# Patient Record
Sex: Female | Born: 1955
Health system: Southern US, Community
[De-identification: ages and names within clinical notes are randomized; demographics above are authoritative.]

## PROBLEM LIST (undated history)

## (undated) DIAGNOSIS — N6310 Unspecified lump in the right breast, unspecified quadrant: Secondary | ICD-10-CM

## (undated) DIAGNOSIS — E785 Hyperlipidemia, unspecified: Secondary | ICD-10-CM

## (undated) HISTORY — PX: BUNIONECTOMY: SHX129

## (undated) HISTORY — PX: BREAST BIOPSY: SHX20

## (undated) HISTORY — PX: BREAST EXCISIONAL BIOPSY: SUR124

---

## 1996-04-03 HISTORY — PX: BREAST EXCISIONAL BIOPSY: SUR124

## 1996-04-03 HISTORY — PX: BREAST SURGERY: SHX581

## 2014-09-15 ENCOUNTER — Other Ambulatory Visit (HOSPITAL_BASED_OUTPATIENT_CLINIC_OR_DEPARTMENT_OTHER): Payer: Self-pay | Admitting: Family Medicine

## 2014-09-15 DIAGNOSIS — Z1231 Encounter for screening mammogram for malignant neoplasm of breast: Secondary | ICD-10-CM

## 2014-09-22 ENCOUNTER — Ambulatory Visit (HOSPITAL_BASED_OUTPATIENT_CLINIC_OR_DEPARTMENT_OTHER)
Admission: RE | Admit: 2014-09-22 | Discharge: 2014-09-22 | Disposition: A | Discharge status: Home / Self Care | Payer: BLUE CROSS/BLUE SHIELD | Source: Ambulatory Visit | Attending: Family Medicine | Admitting: Family Medicine

## 2014-09-22 DIAGNOSIS — Z1231 Encounter for screening mammogram for malignant neoplasm of breast: Secondary | ICD-10-CM | POA: Insufficient documentation

## 2015-08-17 ENCOUNTER — Other Ambulatory Visit (HOSPITAL_BASED_OUTPATIENT_CLINIC_OR_DEPARTMENT_OTHER): Payer: Self-pay | Admitting: Family Medicine

## 2015-08-17 DIAGNOSIS — Z1231 Encounter for screening mammogram for malignant neoplasm of breast: Secondary | ICD-10-CM

## 2015-09-24 DIAGNOSIS — E785 Hyperlipidemia, unspecified: Secondary | ICD-10-CM | POA: Diagnosis not present

## 2015-09-24 DIAGNOSIS — E669 Obesity, unspecified: Secondary | ICD-10-CM | POA: Diagnosis not present

## 2015-09-24 DIAGNOSIS — Z0000 Encounter for general adult medical examination without abnormal findings: Secondary | ICD-10-CM | POA: Diagnosis not present

## 2015-09-24 DIAGNOSIS — Z124 Encounter for screening for malignant neoplasm of cervix: Secondary | ICD-10-CM | POA: Diagnosis not present

## 2015-09-27 ENCOUNTER — Ambulatory Visit (HOSPITAL_BASED_OUTPATIENT_CLINIC_OR_DEPARTMENT_OTHER)
Admission: RE | Admit: 2015-09-27 | Discharge: 2015-09-27 | Disposition: A | Discharge status: Home / Self Care | Payer: BLUE CROSS/BLUE SHIELD | Source: Ambulatory Visit | Attending: Family Medicine | Admitting: Family Medicine

## 2015-09-27 DIAGNOSIS — Z1231 Encounter for screening mammogram for malignant neoplasm of breast: Secondary | ICD-10-CM | POA: Diagnosis not present

## 2015-10-22 DIAGNOSIS — E669 Obesity, unspecified: Secondary | ICD-10-CM | POA: Diagnosis not present

## 2015-12-14 DIAGNOSIS — L820 Inflamed seborrheic keratosis: Secondary | ICD-10-CM | POA: Diagnosis not present

## 2015-12-14 DIAGNOSIS — L821 Other seborrheic keratosis: Secondary | ICD-10-CM | POA: Diagnosis not present

## 2015-12-14 DIAGNOSIS — Z85828 Personal history of other malignant neoplasm of skin: Secondary | ICD-10-CM | POA: Diagnosis not present

## 2015-12-14 DIAGNOSIS — Z08 Encounter for follow-up examination after completed treatment for malignant neoplasm: Secondary | ICD-10-CM | POA: Diagnosis not present

## 2015-12-14 DIAGNOSIS — L570 Actinic keratosis: Secondary | ICD-10-CM | POA: Diagnosis not present

## 2016-09-05 ENCOUNTER — Other Ambulatory Visit (HOSPITAL_BASED_OUTPATIENT_CLINIC_OR_DEPARTMENT_OTHER): Payer: Self-pay | Admitting: Family Medicine

## 2016-09-05 ENCOUNTER — Other Ambulatory Visit: Payer: Self-pay | Admitting: Medical

## 2016-09-05 DIAGNOSIS — Z1231 Encounter for screening mammogram for malignant neoplasm of breast: Secondary | ICD-10-CM

## 2016-09-29 ENCOUNTER — Ambulatory Visit (HOSPITAL_BASED_OUTPATIENT_CLINIC_OR_DEPARTMENT_OTHER)
Admission: RE | Admit: 2016-09-29 | Discharge: 2016-09-29 | Disposition: A | Discharge status: Home / Self Care | Payer: BLUE CROSS/BLUE SHIELD | Source: Ambulatory Visit | Attending: Family Medicine | Admitting: Family Medicine

## 2016-09-29 DIAGNOSIS — R928 Other abnormal and inconclusive findings on diagnostic imaging of breast: Secondary | ICD-10-CM | POA: Insufficient documentation

## 2016-09-29 DIAGNOSIS — Z1231 Encounter for screening mammogram for malignant neoplasm of breast: Secondary | ICD-10-CM | POA: Diagnosis not present

## 2016-10-03 ENCOUNTER — Other Ambulatory Visit: Payer: Self-pay | Admitting: Family Medicine

## 2016-10-03 DIAGNOSIS — R928 Other abnormal and inconclusive findings on diagnostic imaging of breast: Secondary | ICD-10-CM

## 2016-10-10 ENCOUNTER — Ambulatory Visit
Admission: RE | Admit: 2016-10-10 | Discharge: 2016-10-10 | Disposition: A | Discharge status: Home / Self Care | Payer: BLUE CROSS/BLUE SHIELD | Source: Ambulatory Visit | Attending: Family Medicine | Admitting: Family Medicine

## 2016-10-10 ENCOUNTER — Other Ambulatory Visit: Payer: Self-pay | Admitting: Family Medicine

## 2016-10-10 DIAGNOSIS — R928 Other abnormal and inconclusive findings on diagnostic imaging of breast: Secondary | ICD-10-CM

## 2016-10-10 DIAGNOSIS — N6489 Other specified disorders of breast: Secondary | ICD-10-CM

## 2016-10-10 DIAGNOSIS — R922 Inconclusive mammogram: Secondary | ICD-10-CM | POA: Diagnosis not present

## 2016-10-10 DIAGNOSIS — N6311 Unspecified lump in the right breast, upper outer quadrant: Secondary | ICD-10-CM | POA: Diagnosis not present

## 2016-10-11 ENCOUNTER — Ambulatory Visit
Admission: RE | Admit: 2016-10-11 | Discharge: 2016-10-11 | Disposition: A | Discharge status: Home / Self Care | Payer: BLUE CROSS/BLUE SHIELD | Source: Ambulatory Visit | Attending: Family Medicine | Admitting: Family Medicine

## 2016-10-11 DIAGNOSIS — N6489 Other specified disorders of breast: Secondary | ICD-10-CM

## 2016-10-11 DIAGNOSIS — N6011 Diffuse cystic mastopathy of right breast: Secondary | ICD-10-CM | POA: Diagnosis not present

## 2016-10-11 DIAGNOSIS — N6311 Unspecified lump in the right breast, upper outer quadrant: Secondary | ICD-10-CM | POA: Diagnosis not present

## 2016-11-09 ENCOUNTER — Other Ambulatory Visit: Payer: Self-pay | Admitting: General Surgery

## 2016-11-09 DIAGNOSIS — R928 Other abnormal and inconclusive findings on diagnostic imaging of breast: Secondary | ICD-10-CM | POA: Diagnosis not present

## 2016-11-20 DIAGNOSIS — Z1329 Encounter for screening for other suspected endocrine disorder: Secondary | ICD-10-CM | POA: Diagnosis not present

## 2016-11-20 DIAGNOSIS — Z136 Encounter for screening for cardiovascular disorders: Secondary | ICD-10-CM | POA: Diagnosis not present

## 2016-11-20 DIAGNOSIS — Z124 Encounter for screening for malignant neoplasm of cervix: Secondary | ICD-10-CM | POA: Diagnosis not present

## 2016-11-20 DIAGNOSIS — Z0000 Encounter for general adult medical examination without abnormal findings: Secondary | ICD-10-CM | POA: Diagnosis not present

## 2016-11-20 DIAGNOSIS — E559 Vitamin D deficiency, unspecified: Secondary | ICD-10-CM | POA: Diagnosis not present

## 2016-11-20 DIAGNOSIS — Z1211 Encounter for screening for malignant neoplasm of colon: Secondary | ICD-10-CM | POA: Diagnosis not present

## 2016-11-20 DIAGNOSIS — Z131 Encounter for screening for diabetes mellitus: Secondary | ICD-10-CM | POA: Diagnosis not present

## 2016-11-22 ENCOUNTER — Encounter (HOSPITAL_BASED_OUTPATIENT_CLINIC_OR_DEPARTMENT_OTHER): Payer: Self-pay | Admitting: *Deleted

## 2016-11-24 ENCOUNTER — Ambulatory Visit
Admission: RE | Admit: 2016-11-24 | Discharge: 2016-11-24 | Disposition: A | Discharge status: Home / Self Care | Payer: BLUE CROSS/BLUE SHIELD | Source: Ambulatory Visit | Attending: General Surgery | Admitting: General Surgery

## 2016-11-24 DIAGNOSIS — N6489 Other specified disorders of breast: Secondary | ICD-10-CM | POA: Diagnosis not present

## 2016-11-24 DIAGNOSIS — R928 Other abnormal and inconclusive findings on diagnostic imaging of breast: Secondary | ICD-10-CM

## 2016-11-24 NOTE — Progress Notes (Signed)
Ensure pre surgery drink given with instructions, pt verbalized understanding. 

## 2016-11-27 ENCOUNTER — Encounter (HOSPITAL_BASED_OUTPATIENT_CLINIC_OR_DEPARTMENT_OTHER)
Admission: RE | Disposition: A | Discharge status: Home / Self Care | Payer: Self-pay | Source: Ambulatory Visit | Attending: General Surgery

## 2016-11-27 ENCOUNTER — Ambulatory Visit
Admission: RE | Admit: 2016-11-27 | Discharge: 2016-11-27 | Disposition: A | Discharge status: Home / Self Care | Payer: BLUE CROSS/BLUE SHIELD | Source: Ambulatory Visit | Attending: General Surgery | Admitting: General Surgery

## 2016-11-27 ENCOUNTER — Ambulatory Visit (HOSPITAL_BASED_OUTPATIENT_CLINIC_OR_DEPARTMENT_OTHER): Payer: BLUE CROSS/BLUE SHIELD | Admitting: Certified Registered"

## 2016-11-27 ENCOUNTER — Encounter (HOSPITAL_BASED_OUTPATIENT_CLINIC_OR_DEPARTMENT_OTHER): Payer: Self-pay | Admitting: *Deleted

## 2016-11-27 ENCOUNTER — Ambulatory Visit (HOSPITAL_BASED_OUTPATIENT_CLINIC_OR_DEPARTMENT_OTHER)
Admission: RE | Admit: 2016-11-27 | Discharge: 2016-11-27 | Disposition: A | Discharge status: Home / Self Care | Payer: BLUE CROSS/BLUE SHIELD | Source: Ambulatory Visit | Attending: General Surgery | Admitting: General Surgery

## 2016-11-27 DIAGNOSIS — Z8371 Family history of colonic polyps: Secondary | ICD-10-CM | POA: Diagnosis not present

## 2016-11-27 DIAGNOSIS — N63 Unspecified lump in unspecified breast: Secondary | ICD-10-CM | POA: Diagnosis present

## 2016-11-27 DIAGNOSIS — Z79899 Other long term (current) drug therapy: Secondary | ICD-10-CM | POA: Insufficient documentation

## 2016-11-27 DIAGNOSIS — Z800 Family history of malignant neoplasm of digestive organs: Secondary | ICD-10-CM | POA: Diagnosis not present

## 2016-11-27 DIAGNOSIS — Z8249 Family history of ischemic heart disease and other diseases of the circulatory system: Secondary | ICD-10-CM | POA: Insufficient documentation

## 2016-11-27 DIAGNOSIS — Z8042 Family history of malignant neoplasm of prostate: Secondary | ICD-10-CM | POA: Insufficient documentation

## 2016-11-27 DIAGNOSIS — N6489 Other specified disorders of breast: Secondary | ICD-10-CM | POA: Diagnosis not present

## 2016-11-27 DIAGNOSIS — R928 Other abnormal and inconclusive findings on diagnostic imaging of breast: Secondary | ICD-10-CM | POA: Diagnosis not present

## 2016-11-27 DIAGNOSIS — Z823 Family history of stroke: Secondary | ICD-10-CM | POA: Diagnosis not present

## 2016-11-27 DIAGNOSIS — K219 Gastro-esophageal reflux disease without esophagitis: Secondary | ICD-10-CM | POA: Insufficient documentation

## 2016-11-27 DIAGNOSIS — N6011 Diffuse cystic mastopathy of right breast: Secondary | ICD-10-CM | POA: Diagnosis not present

## 2016-11-27 DIAGNOSIS — E7800 Pure hypercholesterolemia, unspecified: Secondary | ICD-10-CM | POA: Diagnosis not present

## 2016-11-27 DIAGNOSIS — Z87891 Personal history of nicotine dependence: Secondary | ICD-10-CM | POA: Diagnosis not present

## 2016-11-27 HISTORY — PX: BREAST EXCISIONAL BIOPSY: SUR124

## 2016-11-27 HISTORY — PX: RADIOACTIVE SEED GUIDED EXCISIONAL BREAST BIOPSY: SHX6490

## 2016-11-27 HISTORY — DX: Hyperlipidemia, unspecified: E78.5

## 2016-11-27 HISTORY — DX: Unspecified lump in the right breast, unspecified quadrant: N63.10

## 2016-11-27 SURGERY — RADIOACTIVE SEED GUIDED BREAST BIOPSY
Anesthesia: General | Site: Breast | Laterality: Right

## 2016-11-27 MED ORDER — EPHEDRINE 5 MG/ML INJ
INTRAVENOUS | Status: AC
  Filled 2016-11-27: qty 20

## 2016-11-27 MED ORDER — PROPOFOL 10 MG/ML IV BOLUS
INTRAVENOUS | Status: DC | PRN
  Administered 2016-11-27: 150 mg via INTRAVENOUS

## 2016-11-27 MED ORDER — GABAPENTIN 300 MG PO CAPS
ORAL_CAPSULE | ORAL | Status: AC
  Filled 2016-11-27: qty 1

## 2016-11-27 MED ORDER — MIDAZOLAM HCL 2 MG/2ML IJ SOLN
INTRAMUSCULAR | Status: AC
  Filled 2016-11-27: qty 2

## 2016-11-27 MED ORDER — OXYCODONE HCL 5 MG PO TABS
ORAL_TABLET | ORAL | Status: AC
  Filled 2016-11-27: qty 1

## 2016-11-27 MED ORDER — ACETAMINOPHEN 500 MG PO TABS
ORAL_TABLET | ORAL | Status: AC
  Filled 2016-11-27: qty 2

## 2016-11-27 MED ORDER — FENTANYL CITRATE (PF) 100 MCG/2ML IJ SOLN
INTRAMUSCULAR | Status: AC
  Filled 2016-11-27: qty 2

## 2016-11-27 MED ORDER — CEFAZOLIN SODIUM-DEXTROSE 2-4 GM/100ML-% IV SOLN
INTRAVENOUS | Status: AC
  Filled 2016-11-27: qty 100

## 2016-11-27 MED ORDER — CEFAZOLIN SODIUM-DEXTROSE 2-4 GM/100ML-% IV SOLN
2.0000 g | INTRAVENOUS | Status: AC
  Administered 2016-11-27: 2 g via INTRAVENOUS

## 2016-11-27 MED ORDER — CELECOXIB 200 MG PO CAPS
ORAL_CAPSULE | ORAL | Status: AC
  Filled 2016-11-27: qty 1

## 2016-11-27 MED ORDER — FENTANYL CITRATE (PF) 100 MCG/2ML IJ SOLN
50.0000 ug | INTRAMUSCULAR | Status: AC | PRN
  Administered 2016-11-27 (×3): 50 ug via INTRAVENOUS

## 2016-11-27 MED ORDER — GABAPENTIN 300 MG PO CAPS
300.0000 mg | ORAL_CAPSULE | ORAL | Status: AC
  Administered 2016-11-27: 300 mg via ORAL

## 2016-11-27 MED ORDER — OXYCODONE HCL 5 MG PO TABS
5.0000 mg | ORAL_TABLET | Freq: Once | ORAL | Status: AC | PRN
  Administered 2016-11-27: 5 mg via ORAL

## 2016-11-27 MED ORDER — SCOPOLAMINE 1 MG/3DAYS TD PT72: 1.0000 | MEDICATED_PATCH | Freq: Once | TRANSDERMAL | Status: DC | PRN

## 2016-11-27 MED ORDER — BUPIVACAINE-EPINEPHRINE (PF) 0.25% -1:200000 IJ SOLN
INTRAMUSCULAR | Status: DC | PRN
  Administered 2016-11-27: 30 mL

## 2016-11-27 MED ORDER — LIDOCAINE HCL (CARDIAC) 20 MG/ML IV SOLN
INTRAVENOUS | Status: DC | PRN
  Administered 2016-11-27: 60 mg via INTRAVENOUS

## 2016-11-27 MED ORDER — LACTATED RINGERS IV SOLN
INTRAVENOUS | Status: DC
  Administered 2016-11-27: 13:00:00 via INTRAVENOUS
  Administered 2016-11-27: 10 mL/h via INTRAVENOUS

## 2016-11-27 MED ORDER — ACETAMINOPHEN 500 MG PO TABS
1000.0000 mg | ORAL_TABLET | ORAL | Status: AC
  Administered 2016-11-27: 1000 mg via ORAL

## 2016-11-27 MED ORDER — ONDANSETRON HCL 4 MG/2ML IJ SOLN
INTRAMUSCULAR | Status: DC | PRN
  Administered 2016-11-27: 4 mg via INTRAVENOUS

## 2016-11-27 MED ORDER — CELECOXIB 200 MG PO CAPS
200.0000 mg | ORAL_CAPSULE | ORAL | Status: AC
  Administered 2016-11-27: 200 mg via ORAL

## 2016-11-27 MED ORDER — DEXAMETHASONE SODIUM PHOSPHATE 4 MG/ML IJ SOLN
INTRAMUSCULAR | Status: DC | PRN
  Administered 2016-11-27: 10 mg via INTRAVENOUS

## 2016-11-27 MED ORDER — MIDAZOLAM HCL 2 MG/2ML IJ SOLN
1.0000 mg | INTRAMUSCULAR | Status: DC | PRN
  Administered 2016-11-27: 1 mg via INTRAVENOUS

## 2016-11-27 SURGICAL SUPPLY — 35 items
BINDER BREAST XLRG (GAUZE/BANDAGES/DRESSINGS) ×2 IMPLANT
BLADE SURG 15 STRL LF DISP TIS (BLADE) ×1 IMPLANT
BLADE SURG 15 STRL SS (BLADE) ×1
CHLORAPREP W/TINT 26ML (MISCELLANEOUS) ×2 IMPLANT
CLIP VESOCCLUDE SM WIDE 6/CT (CLIP) ×2 IMPLANT
COVER BACK TABLE 60X90IN (DRAPES) ×2 IMPLANT
COVER MAYO STAND STRL (DRAPES) ×2 IMPLANT
COVER PROBE W GEL 5X96 (DRAPES) ×2 IMPLANT
DERMABOND ADVANCED (GAUZE/BANDAGES/DRESSINGS) ×1
DERMABOND ADVANCED .7 DNX12 (GAUZE/BANDAGES/DRESSINGS) ×1 IMPLANT
DEVICE DUBIN W/COMP PLATE 8390 (MISCELLANEOUS) ×2 IMPLANT
DRAPE LAPAROSCOPIC ABDOMINAL (DRAPES) ×2 IMPLANT
DRAPE UTILITY XL STRL (DRAPES) ×2 IMPLANT
ELECT COATED BLADE 2.86 ST (ELECTRODE) ×2 IMPLANT
ELECT REM PT RETURN 9FT ADLT (ELECTROSURGICAL) ×2
ELECTRODE REM PT RTRN 9FT ADLT (ELECTROSURGICAL) ×1 IMPLANT
GLOVE BIO SURGEON STRL SZ7 (GLOVE) ×4 IMPLANT
GLOVE BIOGEL PI IND STRL 7.5 (GLOVE) ×1 IMPLANT
GLOVE BIOGEL PI INDICATOR 7.5 (GLOVE) ×1
GOWN STRL REUS W/ TWL LRG LVL3 (GOWN DISPOSABLE) ×2 IMPLANT
GOWN STRL REUS W/TWL LRG LVL3 (GOWN DISPOSABLE) ×2
KIT MARKER MARGIN INK (KITS) ×2 IMPLANT
NEEDLE HYPO 25X1 1.5 SAFETY (NEEDLE) ×2 IMPLANT
PACK BASIN DAY SURGERY FS (CUSTOM PROCEDURE TRAY) ×2 IMPLANT
PENCIL BUTTON HOLSTER BLD 10FT (ELECTRODE) ×2 IMPLANT
SLEEVE SCD COMPRESS KNEE MED (MISCELLANEOUS) ×2 IMPLANT
SPONGE LAP 4X18 X RAY DECT (DISPOSABLE) ×2 IMPLANT
STRIP CLOSURE SKIN 1/2X4 (GAUZE/BANDAGES/DRESSINGS) ×2 IMPLANT
SUT MON AB 5-0 PS2 18 (SUTURE) ×2 IMPLANT
SUT VIC AB 2-0 SH 27 (SUTURE) ×1
SUT VIC AB 2-0 SH 27XBRD (SUTURE) ×1 IMPLANT
SUT VIC AB 3-0 SH 27 (SUTURE) ×1
SUT VIC AB 3-0 SH 27X BRD (SUTURE) ×1 IMPLANT
SYR CONTROL 10ML LL (SYRINGE) ×2 IMPLANT
TOWEL OR 17X24 6PK STRL BLUE (TOWEL DISPOSABLE) ×2 IMPLANT

## 2016-11-27 NOTE — Op Note (Signed)
Preoperative diagnoses: Right breast mammographic lesion with core biopsy discordant Postoperative diagnosis: Same as above Procedure: Right breast seed excisional biopsy Surgeon: Dr. Harden Mo Anesthesia: Gen. Estimated blood loss: minimal Complications: None Drains: None Specimens: Right breast tissue marked with paint Sponge and needle count correct at completion Disposition to recovery stable  Indications: This is a 79 yof with a lesion noted on screening mammogram. The core biopsy is benign and lesion is recommended for excision. We discussed options and have elected to excise this with seed guidance.   Procedure: After informed consent was obtained she was then taken to the operating room. She was given ancef. Sequential compression devices were on her legs. She was placed under general anesthesia without complication. Her chestwas then prepped and draped in the standard sterile surgical fashion. A surgical timeout was then performed. The seed was in the central right breast. I infiltrated marcaine and made a periareolar incision to hide the scar. I then used the neoprobe to guide excision of the seedand surrounding tissue.  Mammogram confirmed removal of seed and the clip. This was then all sent to pathology. Hemostasis was observed. Clip wasplaced in the cavity. I closed the breast tissue with a 2-0 Vicryl. The dermis was closed with 3-0 Vicryl and the skin with 5-0 Monocryl.Dermabond and steristrips were placed on the incision. A breast binder was placed. She was transferred to recovery stable

## 2016-11-27 NOTE — Transfer of Care (Signed)
Immediate Anesthesia Transfer of Care Note  Patient: Cindy Baxter  Procedure(s) Performed: Procedure(s): RADIOACTIVE SEED GUIDED EXCISIONAL RIGHT BREAST BIOPSY (Right)  Patient Location: PACU  Anesthesia Type:General  Level of Consciousness: awake and patient cooperative  Airway & Oxygen Therapy: Patient Spontanous Breathing and Patient connected to face mask oxygen  Post-op Assessment: Report given to RN and Post -op Vital signs reviewed and stable  Post vital signs: Reviewed and stable  Last Vitals:  Vitals:   11/27/16 1101  BP: (!) 148/90  Pulse: 96  Resp: 20  Temp: 36.7 C  SpO2: 99%    Last Pain:  Vitals:   11/27/16 1101  TempSrc: Oral         Complications: No apparent anesthesia complications

## 2016-11-27 NOTE — Anesthesia Postprocedure Evaluation (Signed)
Anesthesia Post Note  Patient: Cindy Baxter  Procedure(s) Performed: Procedure(s) (LRB): RADIOACTIVE SEED GUIDED EXCISIONAL RIGHT BREAST BIOPSY (Right)     Patient location during evaluation: PACU Anesthesia Type: General Level of consciousness: sedated and patient cooperative Pain management: pain level controlled Vital Signs Assessment: post-procedure vital signs reviewed and stable Respiratory status: spontaneous breathing Cardiovascular status: stable Anesthetic complications: no    Last Vitals:  Vitals:   11/27/16 1400 11/27/16 1415  BP: 128/79 115/62  Pulse: 91 (!) 101  Resp: 18 18  Temp: (!) 36.3 C 36.6 C  SpO2: 94% 94%    Last Pain:  Vitals:   11/27/16 1415  TempSrc:   PainSc: 0-No pain                 Lewie Loron

## 2016-11-27 NOTE — Anesthesia Preprocedure Evaluation (Signed)
Anesthesia Evaluation  Patient identified by MRN, date of birth, ID band Patient awake    Reviewed: Allergy & Precautions, NPO status , Patient's Chart, lab work & pertinent test results  Airway Mallampati: II  TM Distance: >3 FB Neck ROM: Full    Dental no notable dental hx.    Pulmonary neg pulmonary ROS, former smoker,    Pulmonary exam normal breath sounds clear to auscultation       Cardiovascular negative cardio ROS Normal cardiovascular exam Rhythm:Regular Rate:Normal     Neuro/Psych negative neurological ROS  negative psych ROS   GI/Hepatic negative GI ROS, Neg liver ROS,   Endo/Other  negative endocrine ROS  Renal/GU negative Renal ROS     Musculoskeletal negative musculoskeletal ROS (+)   Abdominal   Peds  Hematology negative hematology ROS (+)   Anesthesia Other Findings   Reproductive/Obstetrics negative OB ROS                             Anesthesia Physical Anesthesia Plan  ASA: II  Anesthesia Plan: General   Post-op Pain Management:    Induction: Intravenous  PONV Risk Score and Plan: 3 and Ondansetron, Dexamethasone, Midazolam and Scopolamine patch - Pre-op  Airway Management Planned: LMA  Additional Equipment:   Intra-op Plan:   Post-operative Plan: Extubation in OR  Informed Consent: I have reviewed the patients History and Physical, chart, labs and discussed the procedure including the risks, benefits and alternatives for the proposed anesthesia with the patient or authorized representative who has indicated his/her understanding and acceptance.   Dental advisory given  Plan Discussed with: CRNA  Anesthesia Plan Comments:         Anesthesia Quick Evaluation

## 2016-11-27 NOTE — H&P (Signed)
61 yof referred by Dr Gerome Sam who had screening mm that showed right breast mass. density is c. there is persistent distortion in the right uoq. US shows a 9x8x9 mm irregular mass there as well. there is no axillary lad. this underwent core biopsy with result being fcc and focal area of ruptured cyst. she has no mass or dc. prior history of benign left breast excisional biopsy. no fh breast or ovarian cancer.   Past Surgical History (Tanisha A. Manson Passey, RMA; 11/09/2016 1:50 PM) Breast Biopsy  Bilateral. Colon Polyp Removal - Colonoscopy  Foot Surgery  Bilateral.  Diagnostic Studies History (Tanisha A. Manson Passey, RMA; 11/09/2016 1:50 PM) Colonoscopy  1-5 years ago Mammogram  within last year Pap Smear  1-5 years ago  Allergies (Tanisha A. Manson Passey, RMA; 11/09/2016 1:51 PM) No Known Drug Allergies 11/09/2016 Allergies Reconciled   Medication History (Tanisha A. Manson Passey, RMA; 11/09/2016 1:51 PM) Pravastatin Sodium (10MG  Tablet, Oral) Active. Medications Reconciled  Social History (Tanisha A. Manson Passey, RMA; 11/09/2016 1:50 PM) Alcohol use  Moderate alcohol use. Caffeine use  Coffee. Illicit drug use  Remotely quit drug use. Tobacco use  Former smoker.  Family History (Tanisha A. Manson Passey, RMA; 11/09/2016 1:50 PM) Cerebrovascular Accident  Father. Colon Cancer  Father. Colon Polyps  Father, Mother. Heart Disease  Brother, Father. Heart disease in female family member before age 59  Hypertension  Brother, Father, Mother. Prostate Cancer  Brother. Respiratory Condition  Mother.  Pregnancy / Birth History (Tanisha A. Manson Passey, RMA; 11/09/2016 1:50 PM) Age at menarche  17 years. Age of menopause  [age] Contraceptive History  Oral contraceptives. Gravida  0 Irregular periods  Para  0  Other Problems (Tanisha A. Manson Passey, RMA; 11/09/2016 1:50 PM) Gastroesophageal Reflux Disease  Hypercholesterolemia  Lump In Breast     Review of Systems (Tanisha A. Brown RMA; 11/09/2016  1:50 PM) General Not Present- Appetite Loss, Chills, Fatigue, Fever, Night Sweats, Weight Gain and Weight Loss. Skin Not Present- Change in Wart/Mole, Dryness, Hives, Jaundice, New Lesions, Non-Healing Wounds, Rash and Ulcer. HEENT Not Present- Earache, Hearing Loss, Hoarseness, Nose Bleed, Oral Ulcers, Ringing in the Ears, Seasonal Allergies, Sinus Pain, Sore Throat, Visual Disturbances, Wears glasses/contact lenses and Yellow Eyes. Respiratory Present- Snoring. Not Present- Bloody sputum, Chronic Cough, Difficulty Breathing and Wheezing. Breast Present- Breast Mass. Not Present- Breast Pain, Nipple Discharge and Skin Changes. Cardiovascular Not Present- Chest Pain, Difficulty Breathing Lying Down, Leg Cramps, Palpitations, Rapid Heart Rate, Shortness of Breath and Swelling of Extremities. Gastrointestinal Not Present- Abdominal Pain, Bloating, Bloody Stool, Change in Bowel Habits, Chronic diarrhea, Constipation, Difficulty Swallowing, Excessive gas, Gets full quickly at meals, Hemorrhoids, Indigestion, Nausea, Rectal Pain and Vomiting. Female Genitourinary Present- Frequency. Not Present- Nocturia, Painful Urination, Pelvic Pain and Urgency. Musculoskeletal Not Present- Back Pain, Joint Pain, Joint Stiffness, Muscle Pain, Muscle Weakness and Swelling of Extremities. Neurological Not Present- Decreased Memory, Fainting, Headaches, Numbness, Seizures, Tingling, Tremor, Trouble walking and Weakness. Psychiatric Not Present- Anxiety, Bipolar, Change in Sleep Pattern, Depression, Fearful and Frequent crying. Endocrine Present- Hot flashes. Not Present- Cold Intolerance, Excessive Hunger, Hair Changes, Heat Intolerance and New Diabetes. Hematology Not Present- Blood Thinners, Easy Bruising, Excessive bleeding, Gland problems, HIV and Persistent Infections.  Vitals (Tanisha A. Brown RMA; 11/09/2016 1:51 PM) 11/09/2016 1:50 PM Weight: 199.6 lb Height: 64in Body Surface Area: 1.95 m Body Mass Index:  34.26 kg/m  Temp.: 97.37F  Pulse: 105 (Regular)  P.OX: 97% (Room air) BP: 138/82 (Sitting, Left Arm, Standard)  Physical Exam Emelia Loron MD; 11/09/2016 2:17 PM) General Mental Status-Alert. Orientation-Oriented X3.  Head and Neck Trachea-midline. Thyroid Gland Characteristics - normal size and consistency.  Eye Sclera/Conjunctiva - Bilateral-No scleral icterus. Pupil - Bilateral-Normal.  Chest and Lung Exam Chest and lung exam reveals -quiet, even and easy respiratory effort with no use of accessory muscles and on auscultation, normal breath sounds, no adventitious sounds and normal vocal resonance.  Breast Nipples-No Discharge. Breast Lump-No Palpable Breast Mass.  Cardiovascular Cardiovascular examination reveals -normal heart sounds, regular rate and rhythm with no murmurs and no digital clubbing, cyanosis, edema, increased warmth or tenderness.  Lymphatic Head & Neck  General Head & Neck Lymphatics: Bilateral - Description - Normal. Axillary  General Axillary Region: Bilateral - Description - Normal. Note: no [place] adenopathy     Assessment & Plan Emelia Loron MD; 11/09/2016 2:20 PM) ABNORMAL MAMMOGRAM OF RIGHT BREAST (R92.8) Story: Right breast seed guided excisional biopsy we discussed observation vs excision. she very much would like excised and I think reasonable given mass. we discussed seed guided excisional biopsy and risks/recovery.

## 2016-11-27 NOTE — Interval H&P Note (Signed)
History and Physical Interval Note:  11/27/2016 12:05 PM  Cindy Baxter  has presented today for surgery, with the diagnosis of RIGHT BREAST ABNORMAL MAMMOGRAM  The various methods of treatment have been discussed with the patient and family. After consideration of risks, benefits and other options for treatment, the patient has consented to  Procedure(s): RADIOACTIVE SEED GUIDED EXCISIONAL RIGHT BREAST BIOPSY (Right) as a surgical intervention .  The patient's history has been reviewed, patient examined, no change in status, stable for surgery.  I have reviewed the patient's chart and labs.  Questions were answered to the patient's satisfaction.     Kaitlan Bin

## 2016-11-27 NOTE — Anesthesia Procedure Notes (Signed)
Procedure Name: LMA Insertion Date/Time: 11/27/2016 12:21 PM Performed by: Chidera Dearcos D Pre-anesthesia Checklist: Patient identified, Emergency Drugs available, Suction available and Patient being monitored Patient Re-evaluated:Patient Re-evaluated prior to induction Oxygen Delivery Method: Circle system utilized Preoxygenation: Pre-oxygenation with 100% oxygen Induction Type: IV induction Ventilation: Mask ventilation without difficulty LMA: LMA inserted LMA Size: 3.0 Number of attempts: 1 Airway Equipment and Method: Bite block Placement Confirmation: positive ETCO2 Tube secured with: Tape Dental Injury: Teeth and Oropharynx as per pre-operative assessment

## 2016-11-27 NOTE — Discharge Instructions (Signed)
[facility] Office Phone Number [phone]   POST OP INSTRUCTIONS  Always review your discharge instruction sheet given to you by the facility where your surgery was performed.  IF YOU HAVE DISABILITY OR FAMILY LEAVE FORMS, YOU MUST BRING THEM TO THE OFFICE FOR PROCESSING.  DO NOT GIVE THEM TO YOUR DOCTOR. 1. Take tylenol every six hours for next 24 hours. May also use antiinflammatories (ie motrin, advil, alleve) 2. Take your usually prescribed medications unless otherwise directed. 3. You should eat very light the first 24 hours after surgery, such as soup, crackers, pudding, etc.  Resume your normal diet the day after surgery. 4. Most patients will experience some swelling and bruising in the breast.  Ice packs and a good support bra will help.  Wear the breast binder provided or a sports bra for 72 hours day and night.  After that wear a sports bra during the day until you return to the office. Swelling and bruising can take several days to resolve.  5. It is common to experience some constipation if taking pain medication after surgery.  Increasing fluid intake and taking a stool softener will usually help or prevent this problem from occurring.  A mild laxative (Milk of Magnesia or Miralax) should be taken according to package directions if there are no bowel movements after 48 hours. 6. Unless discharge instructions indicate otherwise, you may remove your bandages 48 hours after surgery and you may shower at that time.  You may have steri-strips (small skin tapes) in place directly over the incision.  These strips should be left on the skin for 7-10 days and will come off on their own.  If your surgeon used skin glue on the incision, you may shower in 24 hours.  The glue will flake off over the next 2-3 weeks.  Any sutures or staples will be removed at the office during your follow-up visit. 7. ACTIVITIES:  You may resume regular daily activities (gradually increasing)  beginning the next day.  Wearing a good support bra or sports bra minimizes pain and swelling.  You may have sexual intercourse when it is comfortable. a. You may drive when you no longer are taking prescription pain medication, you can comfortably wear a seatbelt, and you can safely maneuver your car and apply brakes. b. RETURN TO WORK:  ______________________________________________________________________________________ 8. You should see your doctor in the office for a follow-up appointment approximately two weeks after your surgery.  Your doctors nurse will typically make your follow-up appointment when she calls you with your pathology report.  Expect your pathology report 3-4 business days after your surgery.  You may call to check if you do not hear from [place] after three days. 9. OTHER INSTRUCTIONS: _______________________________________________________________________________________________ _____________________________________________________________________________________________________________________________________ _____________________________________________________________________________________________________________________________________ _____________________________________________________________________________________________________________________________________  WHEN TO CALL DR WAKEFIELD: 1. Fever over 101.0 2. Nausea and/or vomiting. 3. Extreme swelling or bruising. 4. Continued bleeding from incision. 5. Increased pain, redness, or drainage from the incision.  The clinic staff is available to answer your questions during regular business hours.  Please dont hesitate to call and ask to speak to one of the nurses for clinical concerns.  If you have a medical emergency, go to the nearest emergency room or call 911.  A surgeon from [facility] is always on call at the hospital.  For further questions, please visit [email or web address]   Post  Anesthesia Home Care Instructions  Activity: Get plenty of rest for the remainder of the day. A responsible individual must  stay with you for 24 hours following the procedure.  For the next 24 hours, DO NOT: -Drive a car -Advertising copywriter -Drink alcoholic beverages -Take any medication unless instructed by your physician -Make any legal decisions or sign important papers.  Meals: Start with liquid foods such as gelatin or soup. Progress to regular foods as tolerated. Avoid greasy, spicy, heavy foods. If nausea and/or vomiting occur, drink only clear liquids until the nausea and/or vomiting subsides. Call your physician if vomiting continues.  Special Instructions/Symptoms: Your throat may feel dry or sore from the anesthesia or the breathing tube placed in your throat during surgery. If this causes discomfort, gargle with warm salt water. The discomfort should disappear within 24 hours.  If you had a scopolamine patch placed behind your ear for the management of post- operative nausea and/or vomiting:  1. The medication in the patch is effective for 72 hours, after which it should be removed.  Wrap patch in a tissue and discard in the trash. Wash hands thoroughly with soap and water. 2. You may remove the patch earlier than 72 hours if you experience unpleasant side effects which may include dry mouth, dizziness or visual disturbances. 3. Avoid touching the patch. Wash your hands with soap and water after contact with the patch.

## 2016-11-28 ENCOUNTER — Encounter (HOSPITAL_BASED_OUTPATIENT_CLINIC_OR_DEPARTMENT_OTHER): Payer: Self-pay | Admitting: General Surgery

## 2016-12-14 DIAGNOSIS — L821 Other seborrheic keratosis: Secondary | ICD-10-CM | POA: Diagnosis not present

## 2016-12-14 DIAGNOSIS — L570 Actinic keratosis: Secondary | ICD-10-CM | POA: Diagnosis not present

## 2016-12-14 DIAGNOSIS — Z85828 Personal history of other malignant neoplasm of skin: Secondary | ICD-10-CM | POA: Diagnosis not present

## 2016-12-14 DIAGNOSIS — Z08 Encounter for follow-up examination after completed treatment for malignant neoplasm: Secondary | ICD-10-CM | POA: Diagnosis not present

## 2017-10-30 ENCOUNTER — Other Ambulatory Visit (HOSPITAL_BASED_OUTPATIENT_CLINIC_OR_DEPARTMENT_OTHER): Payer: Self-pay | Admitting: Family Medicine

## 2017-10-30 DIAGNOSIS — Z1231 Encounter for screening mammogram for malignant neoplasm of breast: Secondary | ICD-10-CM

## 2017-11-26 DIAGNOSIS — Z131 Encounter for screening for diabetes mellitus: Secondary | ICD-10-CM | POA: Diagnosis not present

## 2017-11-26 DIAGNOSIS — E785 Hyperlipidemia, unspecified: Secondary | ICD-10-CM | POA: Diagnosis not present

## 2017-11-26 DIAGNOSIS — Z0000 Encounter for general adult medical examination without abnormal findings: Secondary | ICD-10-CM | POA: Diagnosis not present

## 2017-11-27 ENCOUNTER — Encounter (HOSPITAL_BASED_OUTPATIENT_CLINIC_OR_DEPARTMENT_OTHER): Payer: Self-pay

## 2017-11-27 ENCOUNTER — Ambulatory Visit (HOSPITAL_BASED_OUTPATIENT_CLINIC_OR_DEPARTMENT_OTHER)
Admission: RE | Admit: 2017-11-27 | Discharge: 2017-11-27 | Disposition: A | Discharge status: Home / Self Care | Payer: BLUE CROSS/BLUE SHIELD | Source: Ambulatory Visit | Attending: Family Medicine | Admitting: Family Medicine

## 2017-11-27 DIAGNOSIS — Z1231 Encounter for screening mammogram for malignant neoplasm of breast: Secondary | ICD-10-CM | POA: Diagnosis not present

## 2017-12-27 DIAGNOSIS — L814 Other melanin hyperpigmentation: Secondary | ICD-10-CM | POA: Diagnosis not present

## 2017-12-27 DIAGNOSIS — L918 Other hypertrophic disorders of the skin: Secondary | ICD-10-CM | POA: Diagnosis not present

## 2017-12-27 DIAGNOSIS — L821 Other seborrheic keratosis: Secondary | ICD-10-CM | POA: Diagnosis not present

## 2018-03-11 IMAGING — MG 2D DIGITAL DIAGNOSTIC UNILATERAL RIGHT MAMMOGRAM WITH CAD AND AD
8 of 12 series · 8 of 28 positions shown · non-contrast
Comparison: Previous exam(s).

CLINICAL DATA: Patient recalled from screening for right breast
distortion.

EXAM:
DIGITAL DIAGNOSTIC RIGHT MAMMOGRAM WITH CAD
ULTRASOUND RIGHT BREAST

[R CC]
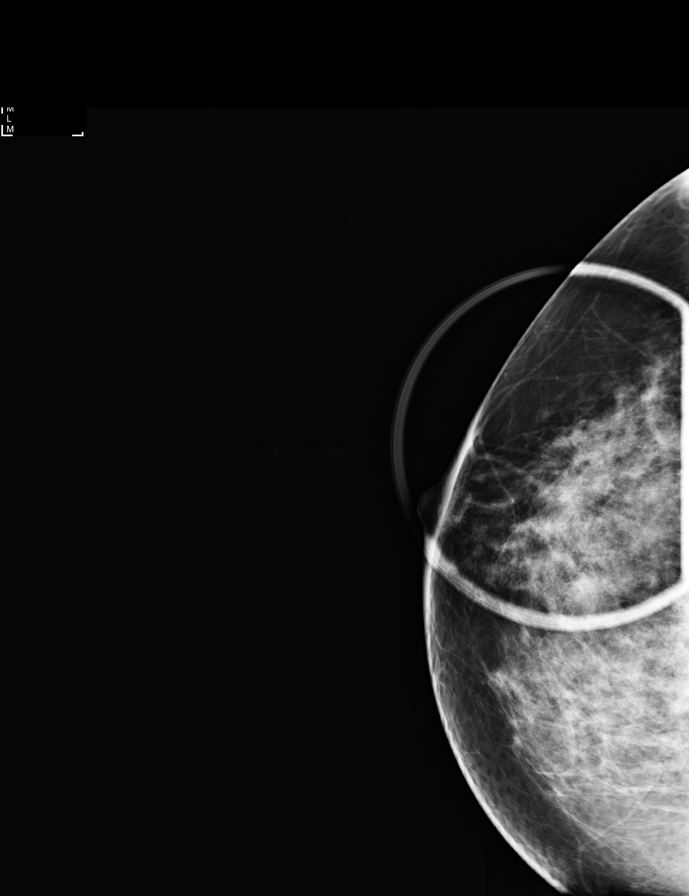

[R MLO (1 of 2)]
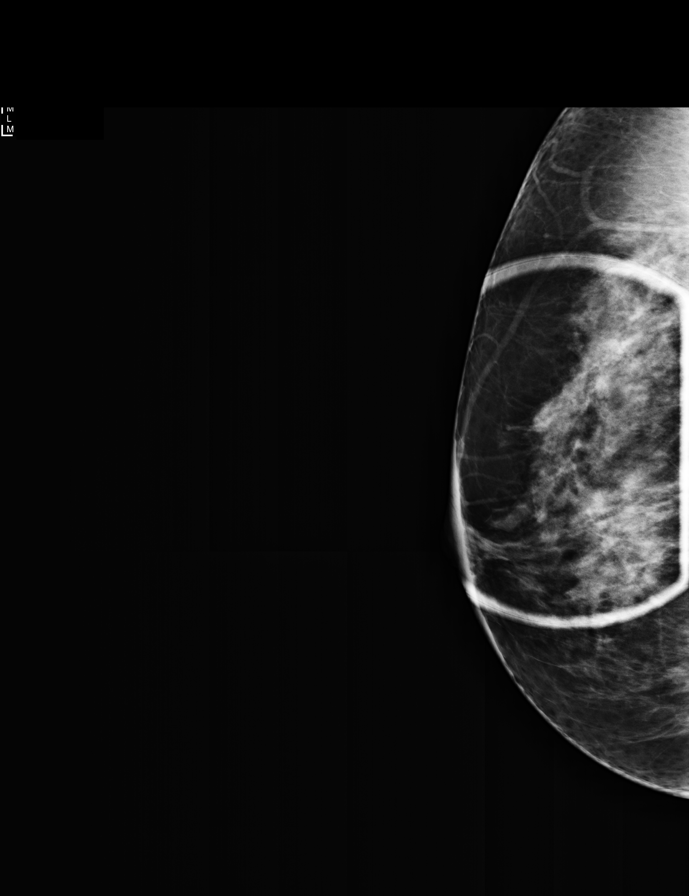

[R MLO synth-2D (1 of 2)]
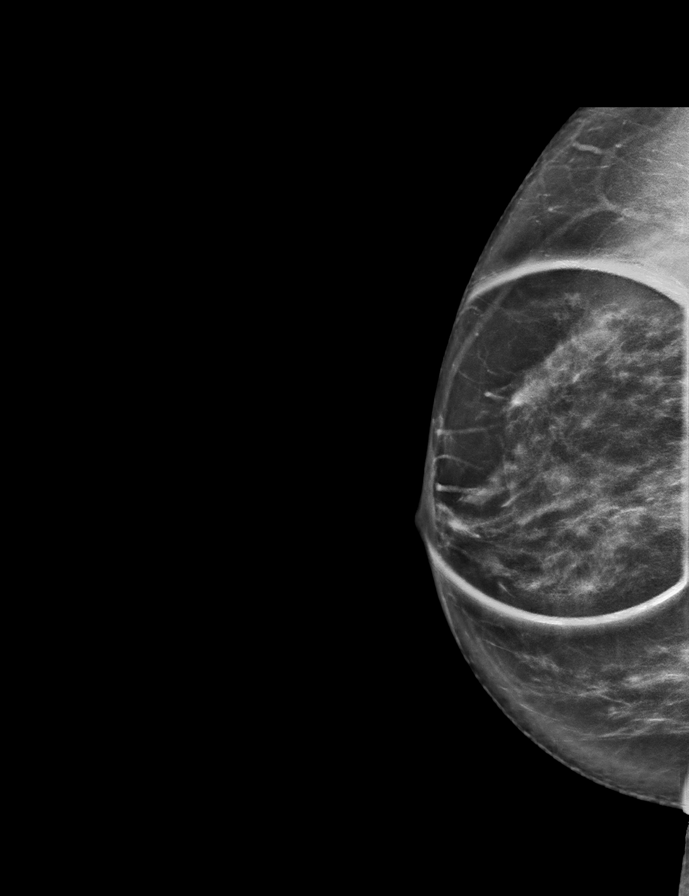

[R CC synth-2D]
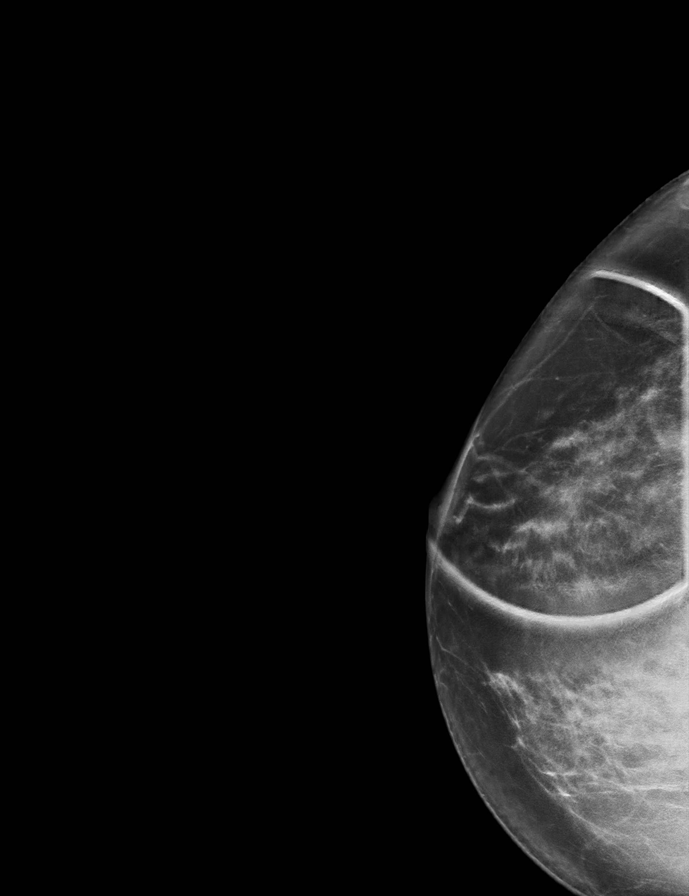

[R ML synth-2D]
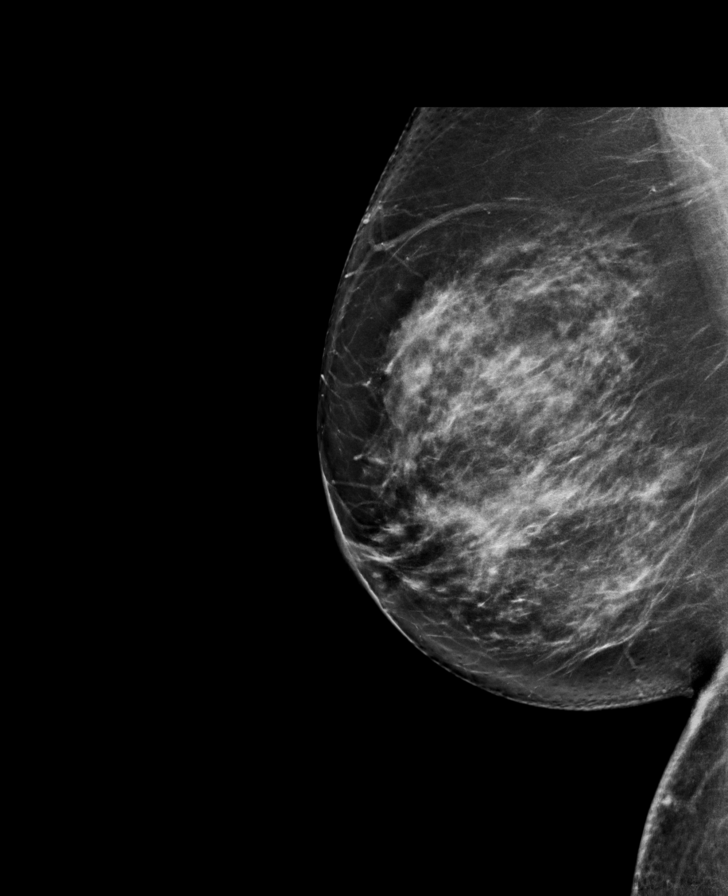

[R MLO synth-2D (2 of 2)]
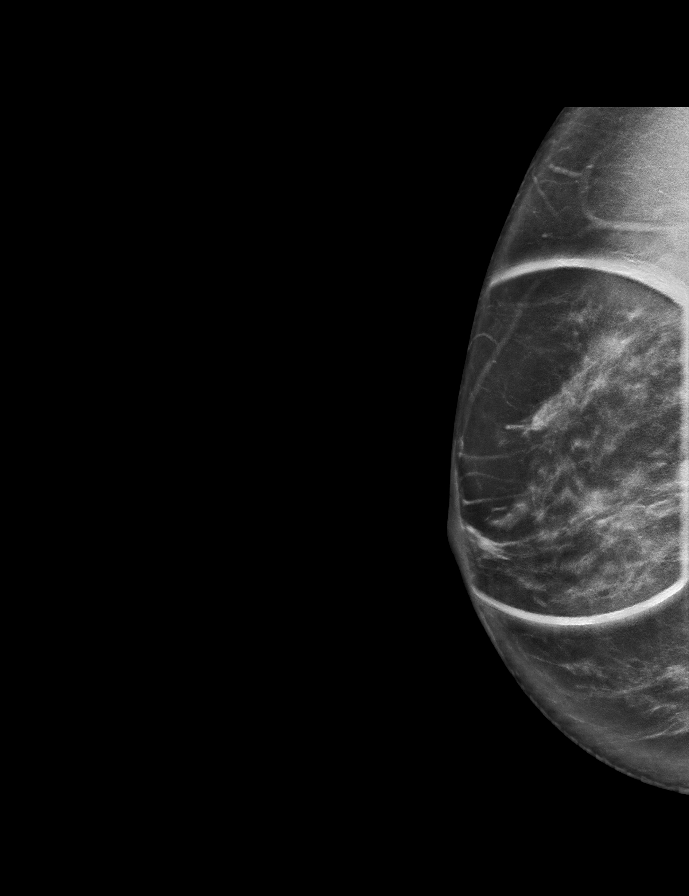

[R ML]
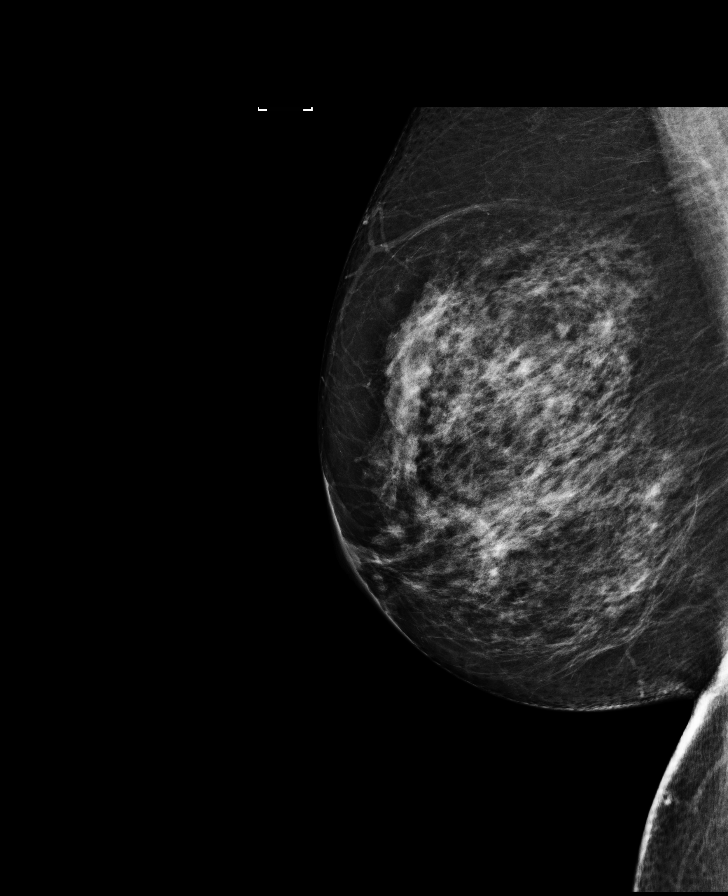

[R MLO (2 of 2)]
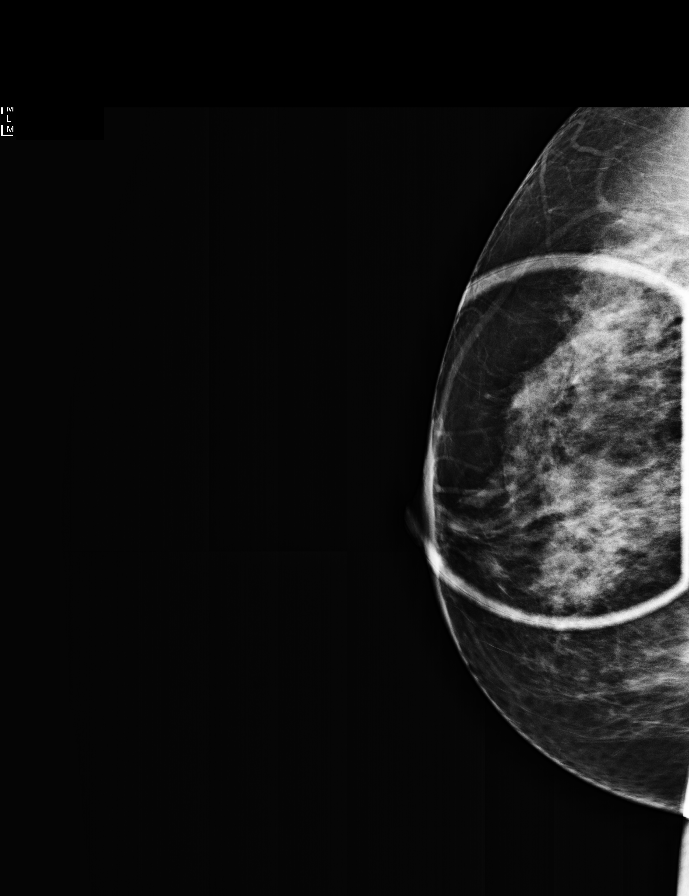

[8 of 28 positions shown; findings below may reference images not displayed]

ACR Breast Density Category c: The breast tissue is heterogeneously
dense, which may obscure small masses.
FINDINGS: Persistent distortion identified within the upper-outer right breast
anterior depth on spot compression tomosynthesis CC and MLO views.

Mammographic images were processed with CAD.

On physical exam, I palpate no discrete mass upper outer right
breast anteriorly.

Targeted ultrasound is performed, showing a 9 x 8 x 9 mm irregular
hypoechoic mass with associated distortion right breast 10 o'clock
position 1 cm from the nipple, corresponding with mammographic
abnormality. No right axillary adenopathy.
IMPRESSION: Irregular hypoechoic right breast mass corresponding with
mammographically identified distortion.

RECOMMENDATION:
Ultrasound-guided core needle biopsy right breast mass.

I have discussed the findings and recommendations with the patient.
Results were also provided in writing at the conclusion of the
visit. If applicable, a reminder letter will be sent to the patient
regarding the next appointment.

BI-RADS CATEGORY  4: Suspicious.

## 2018-03-12 IMAGING — MG MM CLIP PLACEMENT
2 series · 2 of 2 positions shown · non-contrast
Comparison: Previous exam(s).

CLINICAL DATA: Evaluate biopsy marker placement

EXAM:
DIAGNOSTIC RIGHT MAMMOGRAM POST ULTRASOUND BIOPSY

[R CC]
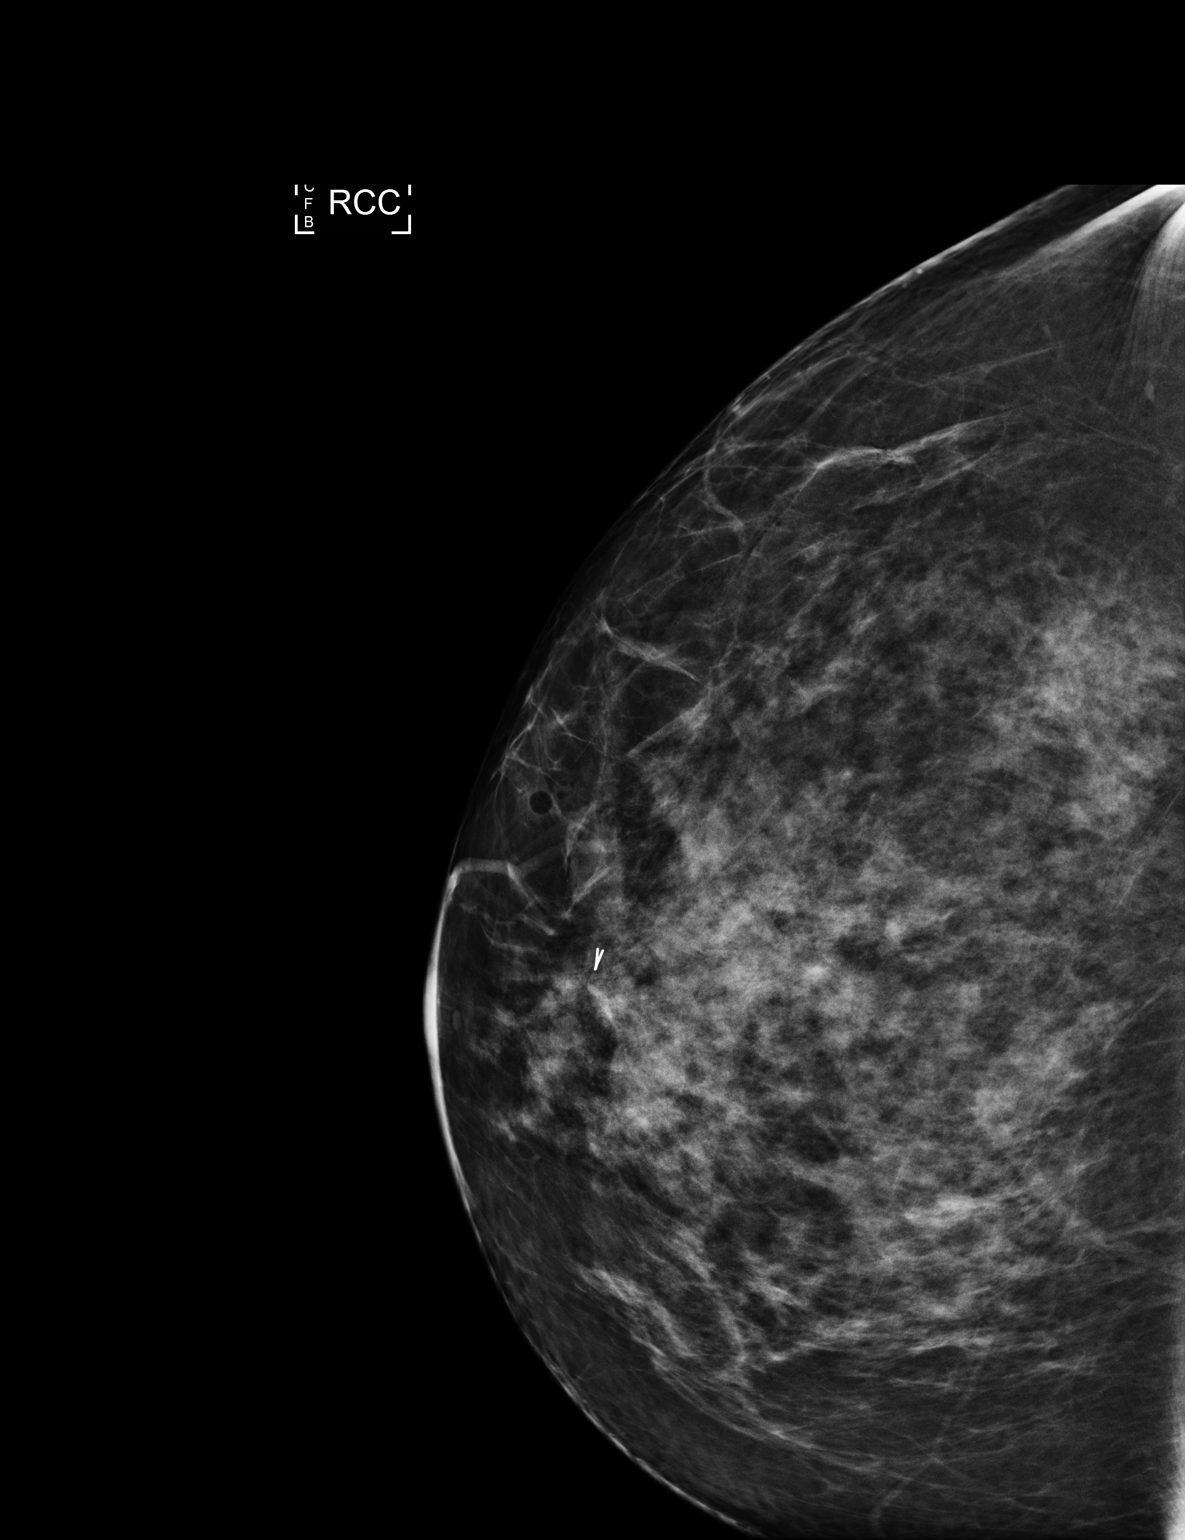

[R ML]
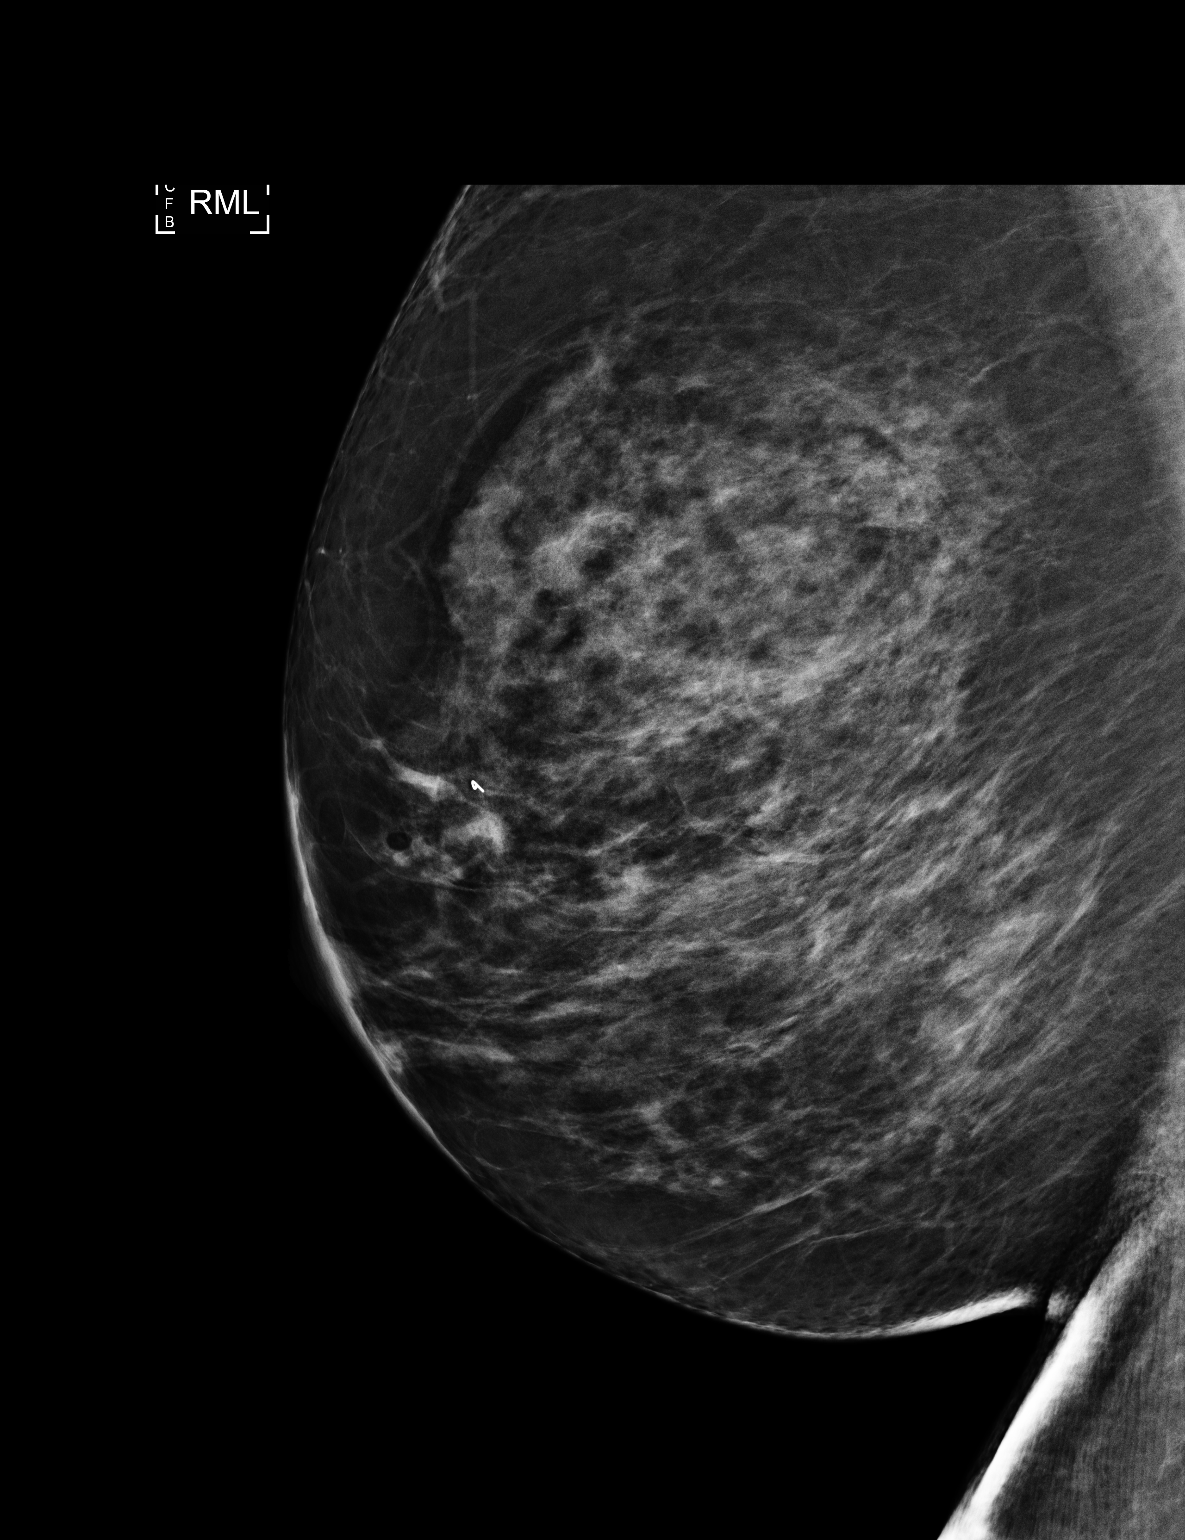

[2 of 2 positions shown; findings below may reference images not displayed]

FINDINGS: Mammographic images were obtained following ultrasound guided biopsy
of a right breast mass. The ribbon shaped biopsy marker is in good
position.
IMPRESSION: Appropriate biopsy marker placement.

Final Assessment: Post Procedure Mammograms for Marker Placement

## 2018-05-18 DIAGNOSIS — J019 Acute sinusitis, unspecified: Secondary | ICD-10-CM | POA: Diagnosis not present

## 2018-05-18 DIAGNOSIS — H6991 Unspecified Eustachian tube disorder, right ear: Secondary | ICD-10-CM | POA: Diagnosis not present

## 2018-06-08 DIAGNOSIS — J019 Acute sinusitis, unspecified: Secondary | ICD-10-CM | POA: Diagnosis not present

## 2018-10-30 ENCOUNTER — Other Ambulatory Visit (HOSPITAL_BASED_OUTPATIENT_CLINIC_OR_DEPARTMENT_OTHER): Payer: Self-pay | Admitting: Family Medicine

## 2018-10-30 DIAGNOSIS — Z1231 Encounter for screening mammogram for malignant neoplasm of breast: Secondary | ICD-10-CM

## 2018-11-28 DIAGNOSIS — E785 Hyperlipidemia, unspecified: Secondary | ICD-10-CM | POA: Diagnosis not present

## 2018-11-28 DIAGNOSIS — Z0000 Encounter for general adult medical examination without abnormal findings: Secondary | ICD-10-CM | POA: Diagnosis not present

## 2018-11-28 DIAGNOSIS — Z23 Encounter for immunization: Secondary | ICD-10-CM | POA: Diagnosis not present

## 2018-12-10 ENCOUNTER — Encounter (HOSPITAL_BASED_OUTPATIENT_CLINIC_OR_DEPARTMENT_OTHER): Payer: Self-pay

## 2018-12-10 ENCOUNTER — Ambulatory Visit (HOSPITAL_BASED_OUTPATIENT_CLINIC_OR_DEPARTMENT_OTHER)
Admission: RE | Admit: 2018-12-10 | Discharge: 2018-12-10 | Disposition: A | Discharge status: Home / Self Care | Payer: BC Managed Care – PPO | Source: Ambulatory Visit | Attending: Family Medicine | Admitting: Family Medicine

## 2018-12-10 ENCOUNTER — Other Ambulatory Visit: Payer: Self-pay

## 2018-12-10 DIAGNOSIS — Z08 Encounter for follow-up examination after completed treatment for malignant neoplasm: Secondary | ICD-10-CM | POA: Diagnosis not present

## 2018-12-10 DIAGNOSIS — Z85828 Personal history of other malignant neoplasm of skin: Secondary | ICD-10-CM | POA: Diagnosis not present

## 2018-12-10 DIAGNOSIS — Z1231 Encounter for screening mammogram for malignant neoplasm of breast: Secondary | ICD-10-CM | POA: Insufficient documentation

## 2018-12-10 DIAGNOSIS — L821 Other seborrheic keratosis: Secondary | ICD-10-CM | POA: Diagnosis not present

## 2018-12-10 DIAGNOSIS — D239 Other benign neoplasm of skin, unspecified: Secondary | ICD-10-CM | POA: Diagnosis not present

## 2019-09-30 ENCOUNTER — Other Ambulatory Visit (HOSPITAL_BASED_OUTPATIENT_CLINIC_OR_DEPARTMENT_OTHER): Payer: Self-pay | Admitting: Family Medicine

## 2019-09-30 DIAGNOSIS — Z1231 Encounter for screening mammogram for malignant neoplasm of breast: Secondary | ICD-10-CM

## 2019-12-10 DIAGNOSIS — X32XXXS Exposure to sunlight, sequela: Secondary | ICD-10-CM | POA: Diagnosis not present

## 2019-12-10 DIAGNOSIS — L720 Epidermal cyst: Secondary | ICD-10-CM | POA: Diagnosis not present

## 2019-12-10 DIAGNOSIS — L821 Other seborrheic keratosis: Secondary | ICD-10-CM | POA: Diagnosis not present

## 2019-12-10 DIAGNOSIS — L814 Other melanin hyperpigmentation: Secondary | ICD-10-CM | POA: Diagnosis not present

## 2019-12-10 DIAGNOSIS — Z0000 Encounter for general adult medical examination without abnormal findings: Secondary | ICD-10-CM | POA: Diagnosis not present

## 2019-12-10 DIAGNOSIS — E785 Hyperlipidemia, unspecified: Secondary | ICD-10-CM | POA: Diagnosis not present

## 2019-12-10 DIAGNOSIS — Z131 Encounter for screening for diabetes mellitus: Secondary | ICD-10-CM | POA: Diagnosis not present

## 2019-12-15 ENCOUNTER — Encounter (HOSPITAL_BASED_OUTPATIENT_CLINIC_OR_DEPARTMENT_OTHER): Payer: Self-pay

## 2019-12-15 ENCOUNTER — Other Ambulatory Visit: Payer: Self-pay

## 2019-12-15 ENCOUNTER — Ambulatory Visit (HOSPITAL_BASED_OUTPATIENT_CLINIC_OR_DEPARTMENT_OTHER)
Admission: RE | Admit: 2019-12-15 | Discharge: 2019-12-15 | Disposition: A | Discharge status: Home / Self Care | Payer: BC Managed Care – PPO | Source: Ambulatory Visit | Attending: Family Medicine | Admitting: Family Medicine

## 2019-12-15 DIAGNOSIS — Z1231 Encounter for screening mammogram for malignant neoplasm of breast: Secondary | ICD-10-CM | POA: Insufficient documentation

## 2019-12-30 DIAGNOSIS — Z23 Encounter for immunization: Secondary | ICD-10-CM | POA: Diagnosis not present

## 2019-12-30 DIAGNOSIS — S46811A Strain of other muscles, fascia and tendons at shoulder and upper arm level, right arm, initial encounter: Secondary | ICD-10-CM | POA: Diagnosis not present

## 2020-05-06 DIAGNOSIS — J069 Acute upper respiratory infection, unspecified: Secondary | ICD-10-CM | POA: Diagnosis not present

## 2020-07-14 DIAGNOSIS — L723 Sebaceous cyst: Secondary | ICD-10-CM | POA: Diagnosis not present

## 2020-07-14 DIAGNOSIS — G501 Atypical facial pain: Secondary | ICD-10-CM | POA: Diagnosis not present

## 2020-07-29 DIAGNOSIS — G501 Atypical facial pain: Secondary | ICD-10-CM | POA: Diagnosis not present

## 2020-07-29 DIAGNOSIS — L723 Sebaceous cyst: Secondary | ICD-10-CM | POA: Diagnosis not present

## 2020-11-15 ENCOUNTER — Other Ambulatory Visit (HOSPITAL_BASED_OUTPATIENT_CLINIC_OR_DEPARTMENT_OTHER): Payer: Self-pay | Admitting: Family Medicine

## 2020-11-15 DIAGNOSIS — Z1231 Encounter for screening mammogram for malignant neoplasm of breast: Secondary | ICD-10-CM

## 2020-12-13 DIAGNOSIS — L820 Inflamed seborrheic keratosis: Secondary | ICD-10-CM | POA: Diagnosis not present

## 2020-12-13 DIAGNOSIS — Z0000 Encounter for general adult medical examination without abnormal findings: Secondary | ICD-10-CM | POA: Diagnosis not present

## 2020-12-13 DIAGNOSIS — L720 Epidermal cyst: Secondary | ICD-10-CM | POA: Diagnosis not present

## 2020-12-13 DIAGNOSIS — X32XXXS Exposure to sunlight, sequela: Secondary | ICD-10-CM | POA: Diagnosis not present

## 2020-12-13 DIAGNOSIS — E785 Hyperlipidemia, unspecified: Secondary | ICD-10-CM | POA: Diagnosis not present

## 2020-12-13 DIAGNOSIS — L821 Other seborrheic keratosis: Secondary | ICD-10-CM | POA: Diagnosis not present

## 2020-12-13 DIAGNOSIS — Z131 Encounter for screening for diabetes mellitus: Secondary | ICD-10-CM | POA: Diagnosis not present

## 2020-12-13 DIAGNOSIS — D485 Neoplasm of uncertain behavior of skin: Secondary | ICD-10-CM | POA: Diagnosis not present

## 2020-12-13 DIAGNOSIS — Z23 Encounter for immunization: Secondary | ICD-10-CM | POA: Diagnosis not present

## 2020-12-13 DIAGNOSIS — L814 Other melanin hyperpigmentation: Secondary | ICD-10-CM | POA: Diagnosis not present

## 2020-12-21 ENCOUNTER — Other Ambulatory Visit: Payer: Self-pay

## 2020-12-21 ENCOUNTER — Ambulatory Visit (HOSPITAL_BASED_OUTPATIENT_CLINIC_OR_DEPARTMENT_OTHER)
Admission: RE | Admit: 2020-12-21 | Discharge: 2020-12-21 | Disposition: A | Discharge status: Home / Self Care | Payer: BC Managed Care – PPO | Source: Ambulatory Visit | Attending: Family Medicine | Admitting: Family Medicine

## 2020-12-21 ENCOUNTER — Encounter (HOSPITAL_BASED_OUTPATIENT_CLINIC_OR_DEPARTMENT_OTHER): Payer: Self-pay

## 2020-12-21 DIAGNOSIS — Z1231 Encounter for screening mammogram for malignant neoplasm of breast: Secondary | ICD-10-CM | POA: Insufficient documentation

## 2020-12-27 DIAGNOSIS — Z800 Family history of malignant neoplasm of digestive organs: Secondary | ICD-10-CM | POA: Diagnosis not present

## 2020-12-27 DIAGNOSIS — K635 Polyp of colon: Secondary | ICD-10-CM | POA: Diagnosis not present

## 2020-12-27 DIAGNOSIS — Z1211 Encounter for screening for malignant neoplasm of colon: Secondary | ICD-10-CM | POA: Diagnosis not present

## 2020-12-27 DIAGNOSIS — K5520 Angiodysplasia of colon without hemorrhage: Secondary | ICD-10-CM | POA: Diagnosis not present

## 2021-01-06 ENCOUNTER — Telehealth (HOSPITAL_BASED_OUTPATIENT_CLINIC_OR_DEPARTMENT_OTHER): Payer: Self-pay

## 2021-05-28 DIAGNOSIS — J0110 Acute frontal sinusitis, unspecified: Secondary | ICD-10-CM | POA: Diagnosis not present

## 2021-05-28 DIAGNOSIS — I10 Essential (primary) hypertension: Secondary | ICD-10-CM | POA: Diagnosis not present

## 2021-05-28 DIAGNOSIS — H6691 Otitis media, unspecified, right ear: Secondary | ICD-10-CM | POA: Diagnosis not present

## 2021-05-28 DIAGNOSIS — Z6831 Body mass index (BMI) 31.0-31.9, adult: Secondary | ICD-10-CM | POA: Diagnosis not present

## 2021-05-28 DIAGNOSIS — R0982 Postnasal drip: Secondary | ICD-10-CM | POA: Diagnosis not present

## 2021-05-28 DIAGNOSIS — E669 Obesity, unspecified: Secondary | ICD-10-CM | POA: Diagnosis not present

## 2021-06-22 DIAGNOSIS — R21 Rash and other nonspecific skin eruption: Secondary | ICD-10-CM | POA: Diagnosis not present

## 2021-11-25 ENCOUNTER — Other Ambulatory Visit (HOSPITAL_BASED_OUTPATIENT_CLINIC_OR_DEPARTMENT_OTHER): Payer: Self-pay | Admitting: Family Medicine

## 2021-11-25 DIAGNOSIS — Z1231 Encounter for screening mammogram for malignant neoplasm of breast: Secondary | ICD-10-CM

## 2021-12-19 DIAGNOSIS — R7301 Impaired fasting glucose: Secondary | ICD-10-CM | POA: Diagnosis not present

## 2021-12-19 DIAGNOSIS — M79671 Pain in right foot: Secondary | ICD-10-CM | POA: Diagnosis not present

## 2021-12-19 DIAGNOSIS — M6530 Trigger finger, unspecified finger: Secondary | ICD-10-CM | POA: Diagnosis not present

## 2021-12-19 DIAGNOSIS — Z23 Encounter for immunization: Secondary | ICD-10-CM | POA: Diagnosis not present

## 2021-12-19 DIAGNOSIS — R030 Elevated blood-pressure reading, without diagnosis of hypertension: Secondary | ICD-10-CM | POA: Diagnosis not present

## 2021-12-19 DIAGNOSIS — Z131 Encounter for screening for diabetes mellitus: Secondary | ICD-10-CM | POA: Diagnosis not present

## 2021-12-19 DIAGNOSIS — Z0000 Encounter for general adult medical examination without abnormal findings: Secondary | ICD-10-CM | POA: Diagnosis not present

## 2021-12-19 DIAGNOSIS — E785 Hyperlipidemia, unspecified: Secondary | ICD-10-CM | POA: Diagnosis not present

## 2021-12-27 ENCOUNTER — Encounter (HOSPITAL_BASED_OUTPATIENT_CLINIC_OR_DEPARTMENT_OTHER): Payer: Self-pay

## 2021-12-27 ENCOUNTER — Ambulatory Visit (HOSPITAL_BASED_OUTPATIENT_CLINIC_OR_DEPARTMENT_OTHER)
Admission: RE | Admit: 2021-12-27 | Discharge: 2021-12-27 | Disposition: A | Discharge status: Home / Self Care | Payer: BC Managed Care – PPO | Source: Ambulatory Visit | Attending: Family Medicine | Admitting: Family Medicine

## 2021-12-27 DIAGNOSIS — Z1231 Encounter for screening mammogram for malignant neoplasm of breast: Secondary | ICD-10-CM | POA: Diagnosis not present

## 2022-08-29 DIAGNOSIS — L309 Dermatitis, unspecified: Secondary | ICD-10-CM | POA: Diagnosis not present

## 2022-11-23 ENCOUNTER — Other Ambulatory Visit (HOSPITAL_BASED_OUTPATIENT_CLINIC_OR_DEPARTMENT_OTHER): Payer: Self-pay | Admitting: Family Medicine

## 2022-11-23 DIAGNOSIS — Z1231 Encounter for screening mammogram for malignant neoplasm of breast: Secondary | ICD-10-CM

## 2022-12-25 DIAGNOSIS — Z131 Encounter for screening for diabetes mellitus: Secondary | ICD-10-CM | POA: Diagnosis not present

## 2022-12-25 DIAGNOSIS — Z0000 Encounter for general adult medical examination without abnormal findings: Secondary | ICD-10-CM | POA: Diagnosis not present

## 2022-12-25 DIAGNOSIS — E785 Hyperlipidemia, unspecified: Secondary | ICD-10-CM | POA: Diagnosis not present

## 2022-12-25 DIAGNOSIS — Z23 Encounter for immunization: Secondary | ICD-10-CM | POA: Diagnosis not present

## 2022-12-25 DIAGNOSIS — Z124 Encounter for screening for malignant neoplasm of cervix: Secondary | ICD-10-CM | POA: Diagnosis not present

## 2022-12-26 ENCOUNTER — Other Ambulatory Visit (HOSPITAL_BASED_OUTPATIENT_CLINIC_OR_DEPARTMENT_OTHER): Payer: Self-pay | Admitting: Family Medicine

## 2022-12-26 DIAGNOSIS — Z780 Asymptomatic menopausal state: Secondary | ICD-10-CM

## 2023-01-04 ENCOUNTER — Other Ambulatory Visit (HOSPITAL_BASED_OUTPATIENT_CLINIC_OR_DEPARTMENT_OTHER): Payer: BC Managed Care – PPO

## 2023-01-04 ENCOUNTER — Ambulatory Visit (HOSPITAL_BASED_OUTPATIENT_CLINIC_OR_DEPARTMENT_OTHER)
Admission: RE | Admit: 2023-01-04 | Discharge: 2023-01-04 | Disposition: A | Discharge status: Home / Self Care | Payer: BC Managed Care – PPO | Source: Ambulatory Visit | Attending: Family Medicine | Admitting: Family Medicine

## 2023-01-04 ENCOUNTER — Encounter (HOSPITAL_BASED_OUTPATIENT_CLINIC_OR_DEPARTMENT_OTHER): Payer: Self-pay

## 2023-01-04 ENCOUNTER — Inpatient Hospital Stay (HOSPITAL_BASED_OUTPATIENT_CLINIC_OR_DEPARTMENT_OTHER): Admission: RE | Admit: 2023-01-04 | Payer: BC Managed Care – PPO | Source: Ambulatory Visit

## 2023-01-04 DIAGNOSIS — M85851 Other specified disorders of bone density and structure, right thigh: Secondary | ICD-10-CM | POA: Insufficient documentation

## 2023-01-04 DIAGNOSIS — Z780 Asymptomatic menopausal state: Secondary | ICD-10-CM | POA: Insufficient documentation

## 2023-01-04 DIAGNOSIS — Z13820 Encounter for screening for osteoporosis: Secondary | ICD-10-CM | POA: Insufficient documentation

## 2023-01-04 DIAGNOSIS — Z1231 Encounter for screening mammogram for malignant neoplasm of breast: Secondary | ICD-10-CM | POA: Diagnosis not present

## 2023-02-14 DIAGNOSIS — D239 Other benign neoplasm of skin, unspecified: Secondary | ICD-10-CM | POA: Diagnosis not present

## 2023-02-14 DIAGNOSIS — L570 Actinic keratosis: Secondary | ICD-10-CM | POA: Diagnosis not present

## 2023-02-14 DIAGNOSIS — L821 Other seborrheic keratosis: Secondary | ICD-10-CM | POA: Diagnosis not present

## 2023-02-14 DIAGNOSIS — L814 Other melanin hyperpigmentation: Secondary | ICD-10-CM | POA: Diagnosis not present

## 2024-01-18 ENCOUNTER — Other Ambulatory Visit (HOSPITAL_BASED_OUTPATIENT_CLINIC_OR_DEPARTMENT_OTHER): Payer: Self-pay | Admitting: Family Medicine

## 2024-01-18 DIAGNOSIS — Z1231 Encounter for screening mammogram for malignant neoplasm of breast: Secondary | ICD-10-CM

## 2024-01-26 ENCOUNTER — Ambulatory Visit (HOSPITAL_BASED_OUTPATIENT_CLINIC_OR_DEPARTMENT_OTHER)
Admission: RE | Admit: 2024-01-26 | Discharge: 2024-01-26 | Disposition: A | Discharge status: Home / Self Care | Source: Ambulatory Visit | Attending: Family Medicine | Admitting: Family Medicine

## 2024-01-26 DIAGNOSIS — Z1231 Encounter for screening mammogram for malignant neoplasm of breast: Secondary | ICD-10-CM | POA: Insufficient documentation

## 2024-02-18 DIAGNOSIS — Z85828 Personal history of other malignant neoplasm of skin: Secondary | ICD-10-CM | POA: Diagnosis not present

## 2024-02-18 DIAGNOSIS — Z08 Encounter for follow-up examination after completed treatment for malignant neoplasm: Secondary | ICD-10-CM | POA: Diagnosis not present

## 2024-02-18 DIAGNOSIS — L821 Other seborrheic keratosis: Secondary | ICD-10-CM | POA: Diagnosis not present

## 2024-02-18 DIAGNOSIS — L570 Actinic keratosis: Secondary | ICD-10-CM | POA: Diagnosis not present

## 2024-03-13 DIAGNOSIS — M25569 Pain in unspecified knee: Secondary | ICD-10-CM | POA: Diagnosis not present

## 2024-03-13 DIAGNOSIS — Z0000 Encounter for general adult medical examination without abnormal findings: Secondary | ICD-10-CM | POA: Diagnosis not present

## 2024-03-13 DIAGNOSIS — Z6835 Body mass index (BMI) 35.0-35.9, adult: Secondary | ICD-10-CM | POA: Diagnosis not present

## 2024-03-13 DIAGNOSIS — E785 Hyperlipidemia, unspecified: Secondary | ICD-10-CM | POA: Diagnosis not present
